# Patient Record
Sex: Male | Born: 1975 | Hispanic: No | Marital: Single | Smoking: Current every day smoker
Health system: Southern US, Community
[De-identification: ages and names within clinical notes are randomized; demographics above are authoritative.]

---

## 2008-02-05 ENCOUNTER — Ambulatory Visit: Payer: Self-pay | Admitting: Diagnostic Radiology

## 2008-02-05 ENCOUNTER — Emergency Department (HOSPITAL_BASED_OUTPATIENT_CLINIC_OR_DEPARTMENT_OTHER): Admission: EM | Admit: 2008-02-05 | Discharge: 2008-02-05 | Payer: Self-pay | Admitting: Emergency Medicine

## 2009-06-20 ENCOUNTER — Emergency Department (HOSPITAL_COMMUNITY): Admission: EM | Admit: 2009-06-20 | Discharge: 2009-06-20 | Payer: Self-pay | Admitting: Emergency Medicine
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
## 2013-09-29 ENCOUNTER — Emergency Department (HOSPITAL_COMMUNITY)
Admission: EM | Admit: 2013-09-29 | Discharge: 2013-09-29 | Disposition: A | Discharge status: Home / Self Care | Payer: Managed Care, Other (non HMO) | Attending: Emergency Medicine | Admitting: Emergency Medicine

## 2013-09-29 ENCOUNTER — Encounter (HOSPITAL_COMMUNITY): Payer: Self-pay | Admitting: Emergency Medicine

## 2013-09-29 DIAGNOSIS — L408 Other psoriasis: Secondary | ICD-10-CM | POA: Insufficient documentation

## 2013-09-29 DIAGNOSIS — F17200 Nicotine dependence, unspecified, uncomplicated: Secondary | ICD-10-CM | POA: Insufficient documentation

## 2013-09-29 DIAGNOSIS — R21 Rash and other nonspecific skin eruption: Secondary | ICD-10-CM | POA: Insufficient documentation

## 2013-09-29 DIAGNOSIS — L308 Other specified dermatitis: Secondary | ICD-10-CM

## 2013-09-29 MED ORDER — BETAMETHASONE DIPROPIONATE 0.05 % EX CREA: TOPICAL_CREAM | Freq: Two times a day (BID) | CUTANEOUS | Status: AC | PRN

## 2013-09-29 NOTE — Discharge Instructions (Signed)
Psoriasis    Psoriasis is a long-lasting (chronic) skin problem. It can cause red bumps, a rash, scales that flake off, bleeding, and joint pain (arthritis). Psoriasis often affects the elbows, knees, groin, genitals, arms, legs, scalp, and nails. Psoriasis cannot be passed from person to person (not contagious).    HOME CARE  · Only take medicine as told by your doctor.  · Keep all doctor visits as told. You may need to try many treatments to find what works for you.  · Avoid sunburn, cuts, scrapes, alcohol, smoking, and stress.  · Wear gloves when you wash dishes, clean, or go outside in the cold.  · Keep the air moist and cool in your home. You can use a humidifier.  · Put lotion on right after a bath or shower.  · Avoid long, hot baths and showers. Do not use a lot of soap.  · Drink enough fluids to keep your pee (urine) clear or pale yellow.  GET HELP RIGHT AWAY IF:    · You have pain in the affected areas.  · You have bleeding that does not stop in the affected areas.  · You have more redness or warmth in the affected areas.  · You have painful or stiff joints.  · You feel depressed.  · You have a fever.  MAKE SURE YOU:  · Understand these instructions.  · Will watch your condition.  · Will get help right away if you are not doing well or get worse.  Document Released: 03/18/2004 Document Revised: 05/03/2011 Document Reviewed: 08/07/2010  [employer]® Patient Information ©2015 [employer]. This information is not intended to replace advice given to you by your health care provider. Make sure you discuss any questions you have with your health care provider.   

## 2013-09-29 NOTE — ED Provider Notes (Signed)
CSN: [id number]     Arrival date & time 09/29/13  1057 History  This chart was scribed for non-physician practitioner working with Brian MawKristen N Ward, DO, by Modena JanskyAlbert Thayil, ED Scribe. This patient was seen in room TR10C/TR10C and the patient's care was started at 12:49 PM.    Chief Complaint  Patient presents with  . Rash   The history is provided by the patient. A language interpreter was used.   HPI Comments: Brian Shea is a 38 y.o. male who presents to the Emergency Department complaining of a right ankle rash that has been going for about 2-3 years. He denies any injury. He states that he was seen in the ED but does not recall the diagnosis. He reports that has has used medication in the past but does not recall the name. He states that has not had pain, only skin irritation and icthiness. He denies any fever, nausea, emesis, and diarrhea.   No PCP  History reviewed. No pertinent past medical history. History reviewed. No pertinent past surgical history. No family history on file. History  Substance Use Topics  . Smoking status: Current Every Day Smoker  . Smokeless tobacco: Not on file  . Alcohol Use: Yes     Comment: occassional    Review of Systems  Constitutional: Negative for fever.  Gastrointestinal: Negative for nausea, vomiting and diarrhea.  Skin: Positive for rash.  All other systems reviewed and are negative.   Allergies  Review of patient's allergies indicates no known allergies.  Home Medications   Prior to Admission medications   Medication Sig Start Date End Date Taking? Authorizing Provider  betamethasone dipropionate (DIPROLENE) 0.05 % cream Apply topically 2 (two) times daily as needed. Apply 1-2x daily to affected area. 09/29/13   Junius FinnerErin O'Malley, PA-C   BP 165/104  Pulse 57  Temp(Src) 98.5 F (36.9 C) (Oral)  Ht 5\' 2"  (1.575 m)  Wt 115 lb 5 oz (52.305 kg)  BMI 21.09 kg/m2  SpO2 100% Physical Exam  Nursing note and vitals reviewed. Constitutional: He is  oriented to person, place, and time. He appears well-developed and well-nourished.  HENT:  Head: Normocephalic and atraumatic.  Eyes: EOM are normal.  Neck: Normal range of motion.  Cardiovascular: Normal rate.   Pulmonary/Chest: Effort normal.  Musculoskeletal: Normal range of motion.  Neurological: He is alert and oriented to person, place, and time.  Skin: Skin is warm and dry. Rash noted.  Right foot on dorsal aspect/extensor surface has diffuse erythematous scaling plague rash. Non tender. No induration or fluctuance. No discharge.   Psychiatric: He has a normal mood and affect. His behavior is normal.    ED Course  Procedures (including critical care time) DIAGNOSTIC STUDIES: Oxygen Saturation is 100% on RA, normal by my interpretation.    COORDINATION OF CARE: 12:53 PM- Pt advised of plan for treatment and pt agrees.  Labs Review Labs Reviewed - No data to display  Imaging Review No results found.   EKG Interpretation None      MDM   Final diagnoses:  Psoriasiform dermatitis    Pt presenting to ED with rash on right foot hx of same. Consistent with psoriasis.  Rx: betamethasone cream. Advised to f/u with [facility] as needed. Pt verbalized understanding and agreement with tx plan.  I personally performed the services described in this documentation, which was scribed in my presence. The recorded information has been reviewed and is accurate.     Junius Finnerrin O'Malley, PA-C 09/29/13 1320

## 2013-09-29 NOTE — ED Notes (Signed)
Pt speaks [place]mese, difficult to understand.  States he has had rash x 2 years on top of right foot, itching.  Will use translator phone to finish triage along with PA.

## 2013-09-29 NOTE — ED Notes (Signed)
PA, nurse, interpreter with patient.   Onset 2 years rash on top of right foot, itching.  Used medicines from pharmacy years ago and it cleared rash, now rash back and pt wants more medication.  Rash not on any other body part.  No other s/s noted. Pt verbalizes PA instructions and pt does not have any further questions.

## 2013-09-29 NOTE — ED Provider Notes (Signed)
Medical screening examination/treatment/procedure(s) were performed by non-physician practitioner and as supervising physician I was immediately available for consultation/collaboration.   EKG Interpretation None        Kristen N Ward, DO 09/29/13 1529 

## 2013-09-29 NOTE — ED Notes (Addendum)
Rt. Foot rash for 2 years.  Looks like psoriasis, Pt. Speaks Barmish but he reports that he understands [place],

## 2014-06-14 ENCOUNTER — Emergency Department (HOSPITAL_COMMUNITY)
Admission: EM | Admit: 2014-06-14 | Discharge: 2014-06-14 | Disposition: A | Discharge status: Home / Self Care | Payer: Managed Care, Other (non HMO) | Attending: Emergency Medicine | Admitting: Emergency Medicine

## 2014-06-14 ENCOUNTER — Encounter (HOSPITAL_COMMUNITY): Payer: Self-pay

## 2014-06-14 DIAGNOSIS — Z720 Tobacco use: Secondary | ICD-10-CM | POA: Diagnosis not present

## 2014-06-14 DIAGNOSIS — L409 Psoriasis, unspecified: Secondary | ICD-10-CM | POA: Insufficient documentation

## 2014-06-14 DIAGNOSIS — R21 Rash and other nonspecific skin eruption: Secondary | ICD-10-CM | POA: Diagnosis present

## 2014-06-14 MED ORDER — CALCIPOTRIENE 0.005 % EX CREA: TOPICAL_CREAM | Freq: Two times a day (BID) | CUTANEOUS | Status: AC

## 2014-06-14 MED ORDER — BETAMETHASONE DIPROPIONATE 0.05 % EX OINT: TOPICAL_OINTMENT | Freq: Two times a day (BID) | CUTANEOUS | Status: AC

## 2014-06-14 MED ORDER — DEXAMETHASONE SODIUM PHOSPHATE 10 MG/ML IJ SOLN
10.0000 mg | Freq: Once | INTRAMUSCULAR | Status: AC
  Administered 2014-06-14: 10 mg via INTRAMUSCULAR
  Filled 2014-06-14: qty 1

## 2014-06-14 NOTE — ED Notes (Signed)
Pt. Reports had rash on right foot for 5 years. Reports increase in itching. Given cream for it with no relief.

## 2014-06-14 NOTE — ED Provider Notes (Signed)
CSN: [id number]     Arrival date & time 06/14/14  1415 History  This chart was scribed for non-physician practitioner Jaynie Crumbleatyana Jericho Alcorn working with Jerelyn ScottMartha Linker, MD by Carl Bestelina Holson, ED Scribe. This patient was seen in room TR07C/TR07C and the patient's care was started at 3:34 PM.    Chief Complaint  Patient presents with  . Rash  . Foot Pain   Patient is a 39 y.o. male presenting with rash and lower extremity pain. The history is provided by the patient. No language interpreter was used.  Rash Foot Pain   HPI Comments: Brian Shea is a 39 y.o. male who presents to the Emergency Department complaining of a rash on his right ankle he noticed 5 years ago.  He states that the rash has gotten bigger and itchier recently.  He was seen for this rash a year ago and was prescribed Betamethasone.  He only used the cream for two weeks after it was prescribed but stopped using it when he did not experience any relief to his symptoms.  He has not noticed this rash anywhere else on his body.  He has never seen a dermatologist for his symptoms and does not have a PCP.   History reviewed. No pertinent past medical history. History reviewed. No pertinent past surgical history. No family history on file. History  Substance Use Topics  . Smoking status: Current Every Day Smoker  . Smokeless tobacco: Not on file  . Alcohol Use: Yes     Comment: occassional    Review of Systems  Skin: Positive for rash.  All other systems reviewed and are negative.   Allergies  Review of patient's allergies indicates no known allergies.  Home Medications   Prior to Admission medications   Medication Sig Start Date End Date Taking? Authorizing Provider  betamethasone dipropionate (DIPROLENE) 0.05 % cream Apply topically 2 (two) times daily as needed. Apply 1-2x daily to affected area. 09/29/13   Junius FinnerErin O'Malley, PA-C   Triage Vitals: BP 140/87 mmHg  Pulse 79  Temp(Src) 98.2 F (36.8 C) (Oral)  Resp 14  SpO2  98%  Physical Exam  Constitutional: He is oriented to person, place, and time. He appears well-developed and well-nourished.  HENT:  Head: Normocephalic and atraumatic.  Eyes: EOM are normal.  Neck: Normal range of motion.  Cardiovascular: Normal rate.   Pulmonary/Chest: Effort normal.  Musculoskeletal: Normal range of motion.  Neurological: He is alert and oriented to person, place, and time.  Skin: Skin is warm and dry.  Large psoriatic plaque to the dorsal and lateral right ankle. Small, approximately 3 cm diameter lesion to the right medial ankle. The rest of the skin is normal. Rashes dry, scaly, on erythematous base.  Psychiatric: He has a normal mood and affect. His behavior is normal.  Nursing note and vitals reviewed.   ED Course  Procedures (including critical care time)  DIAGNOSTIC STUDIES: Oxygen Saturation is 98% on room air, normal by my interpretation.    COORDINATION OF CARE: 3:36 PM- Discussed a clinical suspicion of psoriasis.  Will administer a shot of steroids in the ED.  Will discharge the patient with Betamethasone to be applied BID.  Advised the patient to see a dermatologist for further evaluation and the patient agreed to the treatment plan.    Labs Review Labs Reviewed - No data to display  Imaging Review No results found.   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 Has been there for over 5 years. Will treat with Diprolene and Dovonox. Follow-up with dermatology.  Filed Vitals:   06/14/14 1444 06/14/14 1548  BP: 140/87 137/83  Pulse: 79 68  Temp: 98.2 F (36.8 C)   TempSrc: Oral   Resp: 14 17  SpO2: 98% 100%    I personally performed the services described in this documentation, which was scribed in my presence. The recorded information has been reviewed and is accurate.   Jaynie Crumble, PA-C 06/14/14 1558  Eber Hong, MD 06/21/14 1023

## 2014-06-14 NOTE — Discharge Instructions (Signed)
Apply both creams as prescribed. Follow up with dermatology   Psoriasis Psoriasis is a common, long-lasting (chronic) inflammation of the skin. It affects both men and women equally, of all ages and all races. Psoriasis cannot be passed from person to person (not contagious). Psoriasis varies from mild to very severe. When severe, it can greatly affect your quality of life. Psoriasis is an inflammatory disorder affecting the skin as well as other organs including the joints (causing an arthritis). With psoriasis, the skin sheds its top layer of cells more rapidly than it does in someone without psoriasis. CAUSES  The cause of psoriasis is largely unknown. Genetics, your immune system, and the environment seem to play a role in causing psoriasis. Factors that can make psoriasis worse include:  Damage or trauma to the skin, such as cuts, scrapes, and sunburn. This damage often causes new areas of psoriasis (lesions).  Winter dryness and lack of sunlight.  Medicines such as lithium, beta-blockers, antimalarial drugs, ACE inhibitors, nonsteroidal anti-inflammatory drugs (ibuprofen, aspirin), and terbinafine. Let your caregiver know if you are taking any of these drugs.  Alcohol. Excessive alcohol use should be avoided if you have psoriasis. Drinking large amounts of alcohol can affect:  How well your psoriasis treatment works.  How safe your psoriasis treatment is.  Smoking. If you smoke, ask your caregiver for help to quit.  Stress.  Bacterial or viral infections.  Arthritis. Arthritis associated with psoriasis (psoriatic arthritis) affects less than 10% of patients with psoriasis. The arthritic intensity does not always match the skin psoriasis intensity. It is important to let your caregiver know if your joints hurt or if they are stiff. SYMPTOMS  The most common form of psoriasis begins with little red bumps that gradually become larger. The bumps begin to form scales that flake off  easily. The lower layers of scales stick together. When these scales are scratched or removed, the underlying skin is tender and bleeds easily. These areas then grow in size and may become large. Psoriasis often creates a rash that looks the same on both sides of the body (symmetrical). It often affects the elbows, knees, groin, genitals, arms, legs, scalp, and nails. Affected nails often have pitting, loosen, thicken, crumble, and are difficult to treat.  "Inverse psoriasis"occurs in the armpits, under breasts, in skin folds, and around the groin, buttocks, and genitals.  "Guttate psoriasis" generally occurs in children and young adults following a recent sore throat (strep throat). It begins with many small, red, scaly spots on the skin. It clears spontaneously in weeks or a few months without treatment. DIAGNOSIS  Psoriasis is diagnosed by physical exam. A tissue sample (biopsy) may also be taken. TREATMENT The treatment of psoriasis depends on your age, health, and living conditions.  Steroid (cortisone) creams, lotions, and ointments may be used. These treatments are associated with thinning of the skin, blood vessels that get larger (dilated), loss of skin pigmentation, and easy bruising. It is important to use these steroids as directed by your caregiver. Only treat the affected areas and not the normal, unaffected skin. People on long-term steroid treatment should wear a medical alert bracelet. Injections may be used in areas that are difficult to treat.  Scalp treatments are available as shampoos, solutions, sprays, foams, and oils. Avoid scratching the scalp and picking at the scales.  Anthralin medicine works well on areas that are difficult to treat. However, it stains clothes and skin and may cause temporary irritation.  Synthetic vitamin D (calcipotriene)can be  used on small areas. It is available by prescription. The forms of synthetic vitamin D available in health food stores do not  help with psoriasis.  Coal tarsare available in various strengths for psoriasis that is difficult to treat. They are one of the longest used treatments for difficult to treat psoriasis. However, they are messy to use.  Light therapy (UV therapy) can be carefully and professionally monitored in a dermatologist's office. Careful sunbathing is helpful for many people as directed by your caregiver. The exposure should be just long enough to cause a mild redness (erythema) of your skin. Avoid sunburn as this may make the condition worse. Sunscreen (SPF of 30 or higher) should be used to protect against sunburn. Cataracts, wrinkles, and skin aging are some of the harmful side effects of light therapy.  If creams (topical medicines) fail, there are several other options for systemic or oral medicines your caregiver can suggest. Psoriasis can sometimes be very difficult to treat. It can come and go. It is necessary to follow up with your caregiver regularly if your psoriasis is difficult to treat. Usually, with persistence you can get a good amount of relief. Maintaining consistent care is important. Do not change caregivers just because you do not see immediate results. It may take several trials to find the right combination of treatment for you. PREVENTING FLARE-UPS  Wear gloves while you wash dishes, while cleaning, and when you are outside in the cold.  If you have radiators, place a bowl of water or damp towel on the radiator. This will help put water back in the air. You can also use a humidifier to keep the air moist. Try to keep the humidity at about 60% in your home.  Apply moisturizer while your skin is still damp from bathing or showering. This traps water in the skin.  Avoid long, hot baths or showers. Keep soap use to a minimum. Soaps dry out the skin and wash away the protective oils. Use a fragrance free, dye free soap.  Drink enough water and fluids to keep your urine clear or pale  yellow. Not drinking enough water depletes your skin's water supply.  Turn off the heat at night and keep it low during the day. Cool air is less drying. SEEK MEDICAL CARE IF:  You have increasing pain in the affected areas.  You have uncontrolled bleeding in the affected areas.  You have increasing redness or warmth in the affected areas.  You start to have pain or stiffness in your joints.  You start feeling depressed about your condition.  You have a fever. Document Released: 02/06/2000 Document Revised: 05/03/2011 Document Reviewed: 08/03/2010 [facility] Patient Information 2015 [place], [place]. This information is not intended to replace advice given to you by your health care provider. Make sure you discuss any questions you have with your health care provider.
